# Patient Record
Sex: Male | Born: 1965 | Race: White | Hispanic: No | Marital: Married | Smoking: Never smoker
Health system: Southern US, Community
[De-identification: ages and names within clinical notes are randomized; demographics above are authoritative.]

## PROBLEM LIST (undated history)

## (undated) DIAGNOSIS — N451 Epididymitis: Secondary | ICD-10-CM

## (undated) DIAGNOSIS — J45909 Unspecified asthma, uncomplicated: Secondary | ICD-10-CM

## (undated) DIAGNOSIS — I10 Essential (primary) hypertension: Secondary | ICD-10-CM

## (undated) DIAGNOSIS — E785 Hyperlipidemia, unspecified: Secondary | ICD-10-CM

## (undated) HISTORY — DX: Epididymitis: N45.1

## (undated) HISTORY — DX: Unspecified asthma, uncomplicated: J45.909

## (undated) HISTORY — DX: Essential (primary) hypertension: I10

## (undated) HISTORY — PX: VASECTOMY: SHX75

## (undated) HISTORY — DX: Hyperlipidemia, unspecified: E78.5

## (undated) HISTORY — PX: NASAL SINUS SURGERY: SHX719

## (undated) HISTORY — PX: CHOLECYSTECTOMY: SHX55

---

## 2003-08-16 ENCOUNTER — Observation Stay (HOSPITAL_COMMUNITY): Admission: RE | Admit: 2003-08-16 | Discharge: 2003-08-17 | Payer: Self-pay | Admitting: General Surgery

## 2003-08-16 ENCOUNTER — Encounter (INDEPENDENT_AMBULATORY_CARE_PROVIDER_SITE_OTHER): Payer: Self-pay | Admitting: *Deleted

## 2004-02-21 ENCOUNTER — Ambulatory Visit: Payer: Self-pay | Admitting: Family Medicine

## 2004-03-11 ENCOUNTER — Ambulatory Visit: Payer: Self-pay | Admitting: Family Medicine

## 2004-03-27 ENCOUNTER — Ambulatory Visit: Payer: Self-pay | Admitting: Family Medicine

## 2004-05-20 ENCOUNTER — Ambulatory Visit: Payer: Self-pay | Admitting: Family Medicine

## 2004-07-14 ENCOUNTER — Ambulatory Visit: Payer: Self-pay | Admitting: Family Medicine

## 2004-10-22 ENCOUNTER — Ambulatory Visit: Payer: Self-pay | Admitting: Family Medicine

## 2004-11-04 ENCOUNTER — Ambulatory Visit: Payer: Self-pay | Admitting: Cardiology

## 2004-11-06 ENCOUNTER — Ambulatory Visit: Payer: Self-pay | Admitting: Family Medicine

## 2004-11-20 ENCOUNTER — Ambulatory Visit: Payer: Self-pay | Admitting: Family Medicine

## 2004-11-25 ENCOUNTER — Ambulatory Visit: Payer: Self-pay | Admitting: Cardiology

## 2005-01-20 ENCOUNTER — Ambulatory Visit: Payer: Self-pay | Admitting: Family Medicine

## 2005-01-27 ENCOUNTER — Ambulatory Visit: Payer: Self-pay | Admitting: Family Medicine

## 2005-03-26 ENCOUNTER — Ambulatory Visit: Payer: Self-pay | Admitting: Family Medicine

## 2005-04-09 ENCOUNTER — Ambulatory Visit: Payer: Self-pay | Admitting: Family Medicine

## 2005-04-20 IMAGING — RF DG CHOLANGIOGRAM OPERATIVE
1 series · 20 of 20 positions shown · non-contrast
Comparison: none

CLINICAL DATA: Gallstones.
 INTRAOPERATIVE CHOLANGIOGRAM
 Common bile duct is normal in size and there are no persistent filling defects or strictures.  There is spillage of contrast into the duodenum. 
 IMPRESSION 
 Normal study.

[Series 1: run · 20 of 20 slices shown]
[im 1/20]
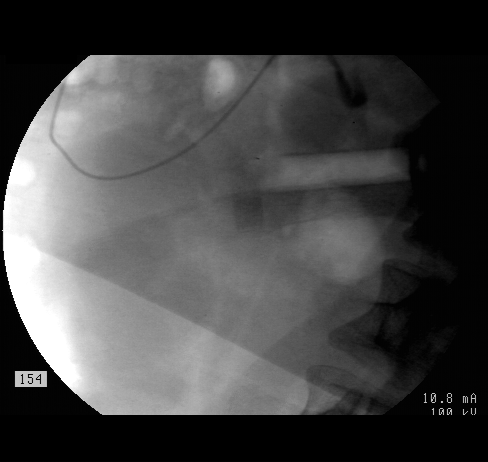
[im 2/20]
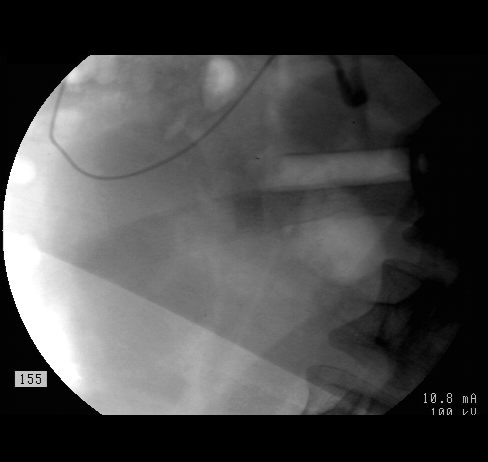
[im 3/20]
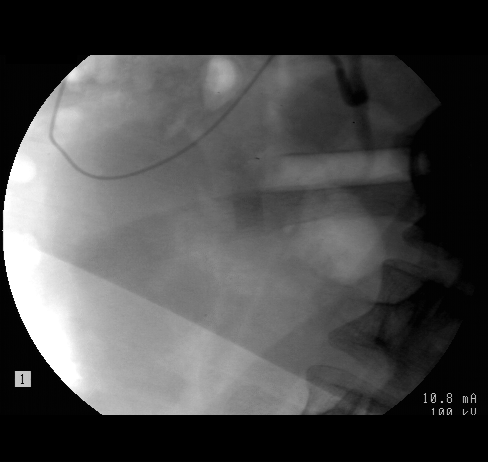
[im 4/20]
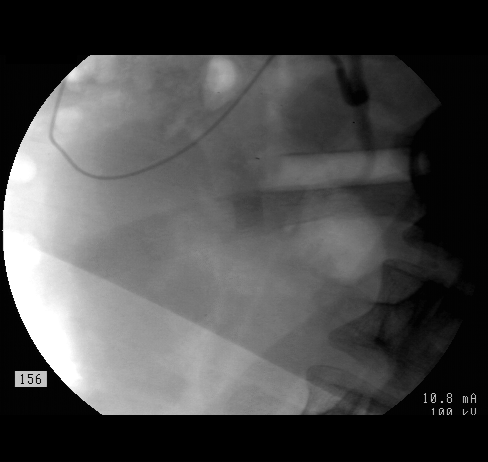
[im 5/20]
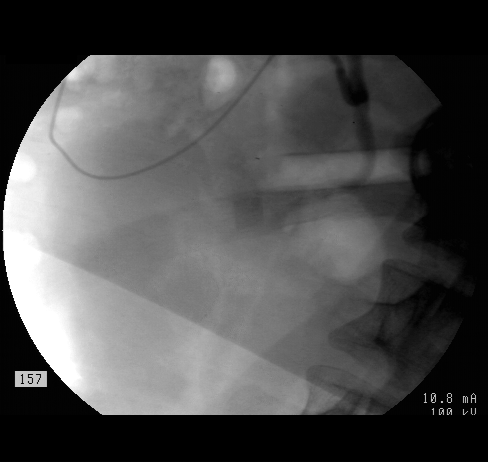
[im 6/20]
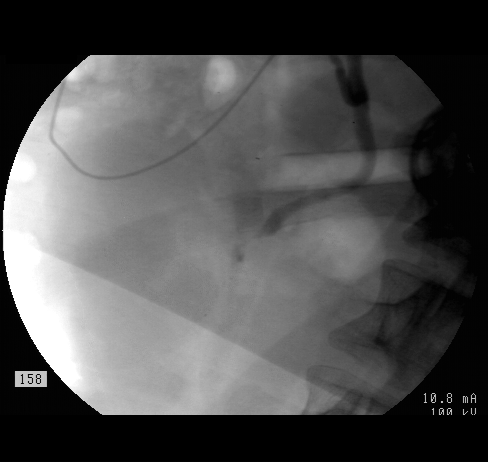
[im 7/20]
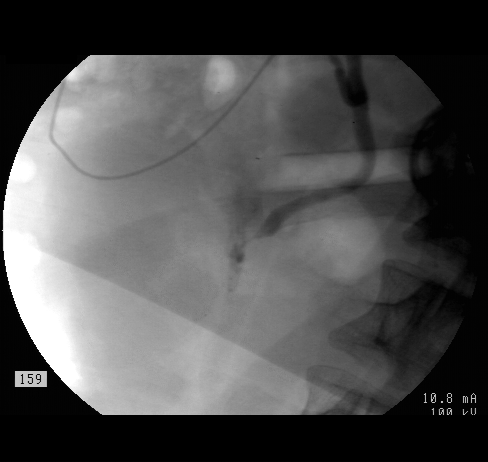
[im 8/20]
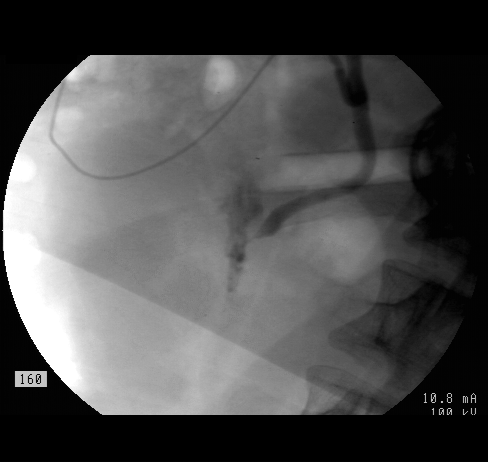
[im 9/20]
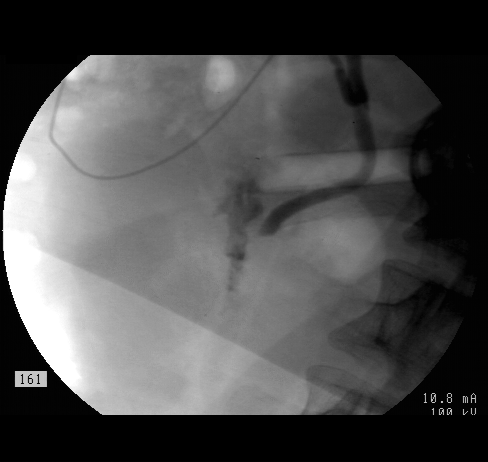
[im 10/20]
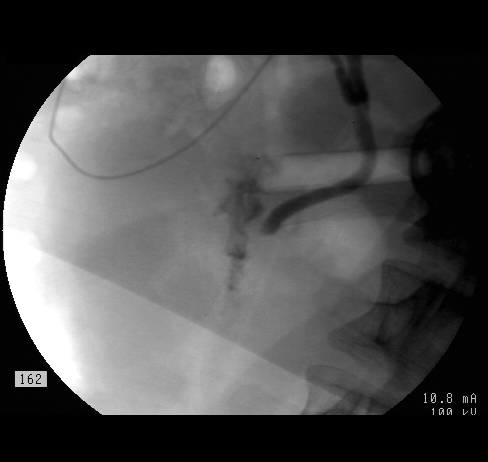
[im 11/20]
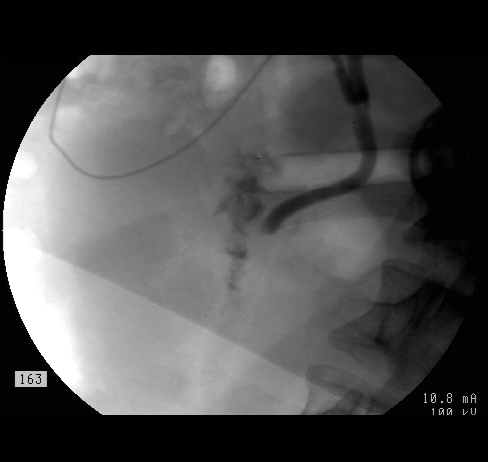
[im 12/20]
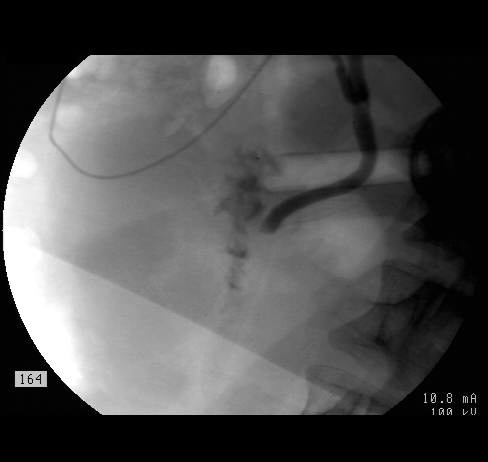
[im 13/20]
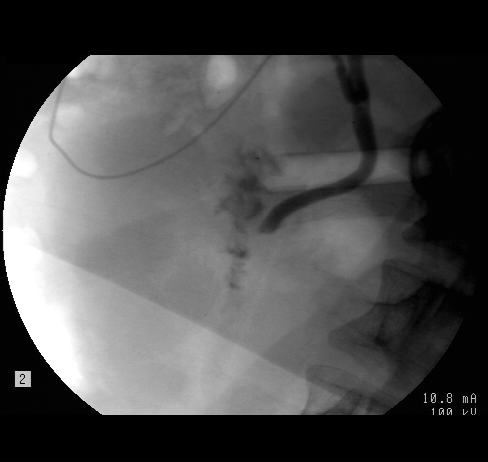
[im 14/20]
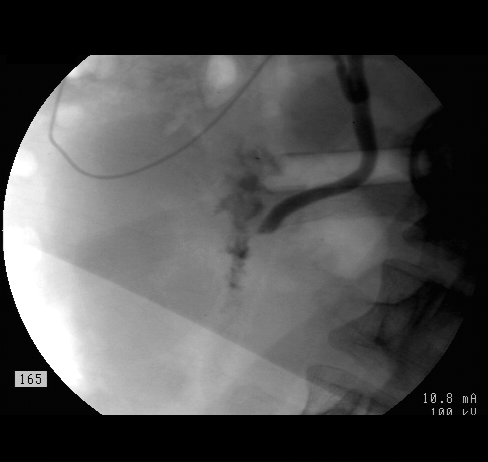
[im 15/20]
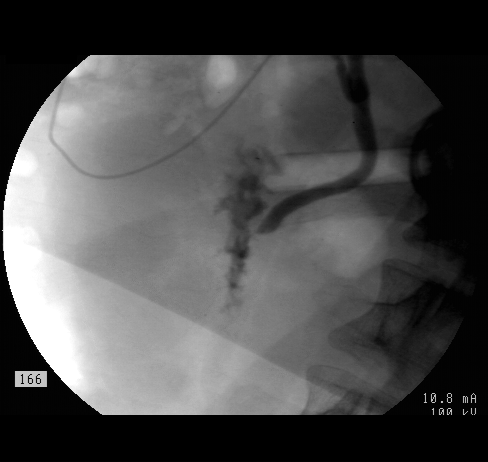
[im 16/20]
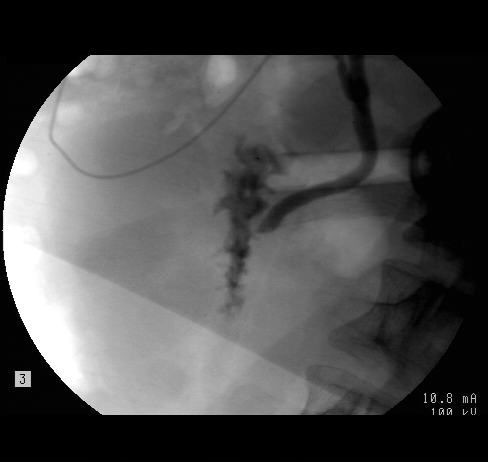
[im 17/20]
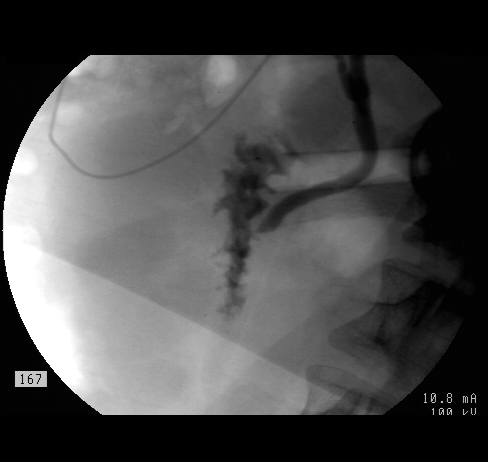
[im 18/20]
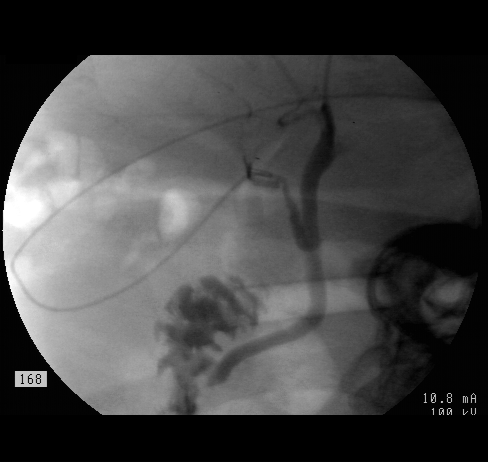
[im 19/20]
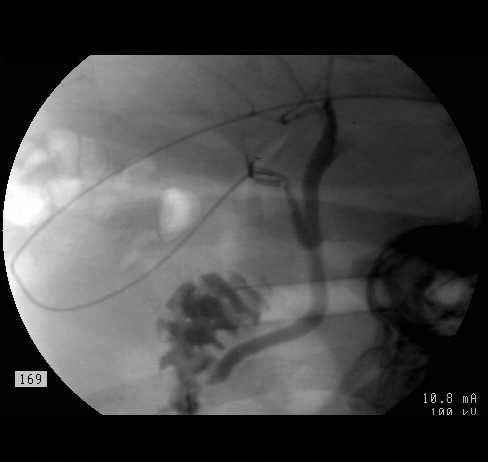
[im 20/20]
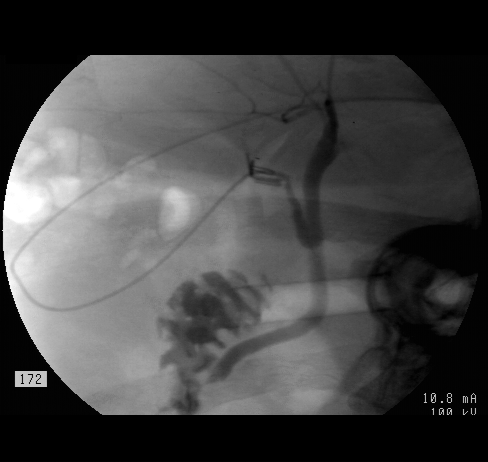

[20 of 20 positions shown; findings below may reference images not displayed]

## 2005-07-16 ENCOUNTER — Ambulatory Visit: Payer: Self-pay | Admitting: Family Medicine

## 2005-08-14 ENCOUNTER — Ambulatory Visit: Payer: Self-pay | Admitting: Family Medicine

## 2005-10-14 ENCOUNTER — Ambulatory Visit: Payer: Self-pay | Admitting: Family Medicine

## 2006-02-10 ENCOUNTER — Ambulatory Visit: Payer: Self-pay | Admitting: Family Medicine

## 2006-03-23 ENCOUNTER — Ambulatory Visit: Payer: Self-pay | Admitting: Family Medicine

## 2006-09-13 ENCOUNTER — Ambulatory Visit: Payer: Self-pay | Admitting: Family Medicine

## 2006-09-27 ENCOUNTER — Ambulatory Visit: Payer: Self-pay | Admitting: Family Medicine

## 2008-07-17 ENCOUNTER — Encounter: Admission: RE | Admit: 2008-07-17 | Discharge: 2008-07-17 | Payer: Self-pay | Admitting: Otolaryngology

## 2009-07-17 ENCOUNTER — Encounter: Admission: RE | Admit: 2009-07-17 | Discharge: 2009-07-17 | Payer: Self-pay | Admitting: Allergy

## 2010-08-22 NOTE — Op Note (Signed)
NAME:  KHIREE, BUKHARI NO.:  [id number]   MEDICAL RECORD NO.:  [id number]                   PATIENT TYPE:  AMB   LOCATION:  DAY                                  FACILITY:  [facility]   PHYSICIAN:  Anselm Pancoast. Zachery Dakins, M.D.          DATE OF BIRTH:  06-25-1965   DATE OF PROCEDURE:  08/16/2003  DATE OF DISCHARGE:                                 OPERATIVE REPORT   PREOPERATIVE DIAGNOSIS:  Chronic cholelithiasis.   POSTOPERATIVE DIAGNOSIS:  Chronic cholelithiasis.   OPERATION:  Laparoscopic cholecystectomy with cholangiogram.   SURGEON:  Dr. Consuello Bossier   ASSISTANT:  Dr. Kendrick Ranch   ANESTHESIA:  General.   HISTORY:  Per Beagley is a 45 year old Caucasian male referred to Dr.  Lysbeth Galas, who states that has had probably 5-6 episodes of epigastric pain, is  always right upper quadrant, sometimes after eating, occasionally has had  kind of painful to the back.  He was seen in Dr. Joyce Copa office and  referred to [facility] where he had an ultrasound of the gallbladder that  showed gallstones and a normal extrahepatic biliary system.  I saw him in  the office, and he said that he had recently been treated for pneumonitis  with oral antibiotics, and I think he is a cigarette smoker, and I  recommended he complete his course of antibiotics, and we would plan on  proceeding with microscopic cholecystectomy.  He comes in now, and he had  laboratory studies with a normal white count, normal liver function studies.  There was a slight elevation of his white cells, viral-type __________,  monocytes, etc.   Preoperatively, he was given 3 g of Unasyn, PAS stockings, and was taken to  the operative suite.  Induction of general anesthesia, endotracheal tube,  oral tube into the stomach, and then the abdomen was prepped with Betadine  surgical solution and draped in a sterile manner.  A small incision was made  right in the umbilicus and then sharp dissection and the  fascia identified.  There was a little preperitoneal fatty tissue coming up right at the  umbilicus, and I pushed that back in and then identified the fascia, made a  small opening, and then a pursestring suture of 0 Vicryl placed and Hasson  cannula introduced.  The gallbladder was moderately distended but not  acutely inflamed.  The upper 10 mm trocar was placed in subxiphoid area,  through the falciform ligament after anesthetizing the fascia and two  lateral 5 mm trocars were placed by Dr. Earlene Plater.  The gallbladder was  retracted upward and outward and then followed down.  You could see where  the transition of the proximal gallbladder cystic duct was, and this was  carefully opened.  The artery was kind of anterior to the duct, doubly  clipped it proximally, singly distally, and divided it, and exposed the  cystic duct junction of the gallbladder.  A  clip was placed flush across the  neck of the gallbladder, small opening made, and cholangiocatheter placed  into the proximal duct, and then held in place with a clip.  The x-rays  showed good extrahepatic biliary system, good flow into the duodenum.  There  was probably about a 3 cm cystic duct remnant.  The catheter was removed.  The cystic duct was clipped probably about 1 cm more proximally, triply,  then divided and then the gallbladder freed from its bed using the hook  electrocautery.  Hemostasis was good.  The gallbladder was then grasped,  brought out through the umbilicus.  The figure-of-eight suture, in addition  to the fascia suture was placed, and then another figure-of-eight was placed  except he had that little weakness at the umbilicus, and there was no  evidence of any herniation through the fascia.  The irrigating fluid had  been aspirated, anesthetized the fascia, and then the subcutaneous wounds  were closed with 4-0 Vicryl, Benzoin  and Steri-Strips on the skin.  The patient tolerated the procedure nicely  and was sent  to the recovery room in stable postop condition.  He will be  released either this evening or in the morning, hopefully will be able to  return to work in approximately a week.  He is a Network engineer at  [employer].                                               Anselm Pancoast. Zachery Dakins, M.D.    WJW/MEDQ  D:  08/16/2003  T:  08/16/2003  Job:  [id number]   cc:   Dr. Eloise Harman [facility]

## 2011-11-11 ENCOUNTER — Encounter (HOSPITAL_BASED_OUTPATIENT_CLINIC_OR_DEPARTMENT_OTHER): Payer: Self-pay

## 2011-11-18 ENCOUNTER — Encounter (HOSPITAL_BASED_OUTPATIENT_CLINIC_OR_DEPARTMENT_OTHER): Payer: Self-pay

## 2011-11-23 ENCOUNTER — Ambulatory Visit (HOSPITAL_BASED_OUTPATIENT_CLINIC_OR_DEPARTMENT_OTHER): Payer: Managed Care, Other (non HMO) | Attending: Family Medicine

## 2011-11-23 DIAGNOSIS — G4733 Obstructive sleep apnea (adult) (pediatric): Secondary | ICD-10-CM | POA: Insufficient documentation

## 2011-11-28 DIAGNOSIS — G4733 Obstructive sleep apnea (adult) (pediatric): Secondary | ICD-10-CM

## 2011-12-09 ENCOUNTER — Encounter: Payer: Self-pay | Admitting: Internal Medicine

## 2011-12-09 ENCOUNTER — Encounter: Payer: Self-pay | Admitting: *Deleted

## 2011-12-10 ENCOUNTER — Ambulatory Visit (INDEPENDENT_AMBULATORY_CARE_PROVIDER_SITE_OTHER): Payer: Managed Care, Other (non HMO) | Admitting: Internal Medicine

## 2011-12-10 ENCOUNTER — Encounter: Payer: Self-pay | Admitting: Internal Medicine

## 2011-12-10 VITALS — BP 132/82 | HR 73 | Ht 71.0 in | Wt 236.6 lb

## 2011-12-10 DIAGNOSIS — G4733 Obstructive sleep apnea (adult) (pediatric): Secondary | ICD-10-CM

## 2011-12-10 NOTE — Progress Notes (Signed)
12/10/11- 46 yo M never smoker referred courtesy of Dr. Lysbeth Galas for sleep medicine evaluation. He complains that he stays tired. 4 years he has wakened 2 or 3 times per night after bedtime between 10 and 11 PM. 30 minutes sleep latency. Loud snoring. In the morning he feels unrested with no dream recall. He denies problems driving but if he sits at home he falls asleep. Caffeine intake described as 2 or 3 Pepsis per day. Allergic rhinitis managed by Dr. [place] Callas with allergy vaccine which has helped nasal congestion. Medical problems include hypertension and asthma. Nasal polypectomy and maxillary sinus surgery in the past but no aspirin intolerance. No family history of sleep apnea. Father has history of coronary disease. Sleep study- an unattended home sleep study on 11/23/2011 documented mild obstructive sleep apnea/hypoxia syndrome, AHI 10.3 per hour with oxygen desaturation to a nadir of 66%.  Prior to Admission medications   Medication Sig Start Date End Date Taking? Authorizing Provider  albuterol (PROVENTIL HFA;VENTOLIN HFA) 108 (90 BASE) MCG/ACT inhaler Inhale 2 puffs into the lungs every 6 (six) hours as needed.   Yes Historical Provider, MD  Amlodipine-Valsartan-HCTZ (EXFORGE HCT) 5-160-12.5 MG TABS Take by mouth. As directed   Yes Historical Provider, MD  atorvastatin (LIPITOR) 20 MG tablet Take 20 mg by mouth daily.   Yes Historical Provider, MD  fluticasone (FLONASE) 50 MCG/ACT nasal spray Place 2 sprays into the nose daily.   Yes Historical Provider, MD  Fluticasone-Salmeterol (ADVAIR) 250-50 MCG/DOSE AEPB Inhale 1 puff into the lungs every 12 (twelve) hours.   Yes Historical Provider, MD   Past Medical History  Diagnosis Date  . Asthma   . HTN (hypertension)   . Hyperlipemia   . Epididymitis    Past Surgical History  Procedure Date  . Vasectomy    Family History  Problem Relation Age of Onset  . Heart disease Father     bypass surgeries x 2  . Asthma Mother   . Cancer     grandfather   History   Social History  . Marital Status: Married    Spouse Name: N/A    Number of Children: 2  . Years of Education: N/A   Occupational History  . plant supervisor    Social History Main Topics  . Smoking status: Never Smoker   . Smokeless tobacco: Not on file  . Alcohol Use: Not on file  . Drug Use: No  . Sexually Active: Not on file   Other Topics Concern  . Not on file   Social History Narrative  . No narrative on file   ROS-see HPI Constitutional:   No-   weight loss, night sweats, fevers, chills, fatigue, lassitude. HEENT:   No-  headaches, difficulty swallowing, tooth/dental problems, sore throat,       Controlled- sneezing, itching, ear ache, nasal congestion, post nasal drip,  CV:  No-   chest pain, orthopnea, PND, swelling in lower extremities, anasarca, dizziness, palpitations Resp: No-   shortness of breath with exertion or at rest.              No-   productive cough,  No non-productive cough,  No- coughing up of blood.              No-   change in color of mucus.  No- wheezing.   Skin: No-   rash or lesions. GI:  No-   heartburn, indigestion, abdominal pain, nausea, vomiting, diarrhea,  change in bowel habits, loss of appetite GU: No-   dysuria, change in color of urine, no urgency or frequency.  No- flank pain. MS:  No-   joint pain or swelling.  No- decreased range of motion.  No- back pain. Neuro-     nothing unusual Psych:  No- change in mood or affect. No depression or anxiety.  No memory loss.  OBJ- Physical Exam General- Alert, Oriented, Affect-appropriate, Distress- none acute, stocky body build Skin- rash-none, lesions- none, excoriation- none Lymphadenopathy- none Head- atraumatic            Eyes- Gross vision intact, PERRLA, conjunctivae and secretions clear            Ears- Hearing, canals-normal            Nose- + mucus, no-Septal dev,  polyps, erosion, perforation             Throat- Mallampati III , mucosa  clear , drainage- none, +tonsils-are present  Neck- flexible , trachea midline, no stridor , thyroid nl, carotid no bruit Chest - symmetrical excursion , unlabored           Heart/CV- RRR , no murmur , no gallop  , no rub, nl s1 s2                           - JVD- none , edema- none, stasis changes- none, varices- none           Lung- clear to P&A, wheeze- none, cough- none , dullness-none, rub- none           Chest wall-  Abd- tender-no, distended-no, bowel sounds-present, HSM- no Br/ Gen/ Rectal- Not done, not indicated Extrem- cyanosis- none, clubbing, none, atrophy- none, strength- nl Neuro- grossly intact to observation

## 2011-12-10 NOTE — Patient Instructions (Addendum)
[facility] autotitrate 5-20 cwp x 7-14 days for pressure recommendation, mask of choice, humidifier    Dx OSA  Please call as needed

## 2011-12-19 DIAGNOSIS — G4733 Obstructive sleep apnea (adult) (pediatric): Secondary | ICD-10-CM | POA: Insufficient documentation

## 2011-12-19 NOTE — Assessment & Plan Note (Signed)
We have reviewed the basics of good sleep hygiene, physiology of sleep apnea, medical concerns of untreated sleep apnea and treatment choices. I have emphasized weight control and his responsibility to drive safely. Plan-new CPAP starting with autotitration.

## 2012-01-26 ENCOUNTER — Ambulatory Visit: Payer: Managed Care, Other (non HMO) | Admitting: Internal Medicine

## 2012-02-15 ENCOUNTER — Encounter: Payer: Self-pay | Admitting: Internal Medicine

## 2012-03-15 ENCOUNTER — Encounter: Payer: Self-pay | Admitting: Internal Medicine

## 2012-03-15 ENCOUNTER — Ambulatory Visit (INDEPENDENT_AMBULATORY_CARE_PROVIDER_SITE_OTHER): Payer: Managed Care, Other (non HMO) | Admitting: Internal Medicine

## 2012-03-15 VITALS — BP 120/74 | HR 75 | Ht 71.0 in | Wt 237.2 lb

## 2012-03-15 DIAGNOSIS — G4733 Obstructive sleep apnea (adult) (pediatric): Secondary | ICD-10-CM

## 2012-03-15 NOTE — Patient Instructions (Addendum)
Order- [facility] Allegra- change CPAP to fixed pressure 9 cwp  Please call as needed

## 2012-03-15 NOTE — Progress Notes (Signed)
12/10/11- 46 yo M never smoker referred courtesy of Dr. Lysbeth Galas for sleep medicine evaluation. He complains that he stays tired. 4 years he has wakened 2 or 3 times per night after bedtime between 10 and 11 PM. 30 minutes sleep latency. Loud snoring. In the morning he feels unrested with no dream recall. He denies problems driving but if he sits at home he falls asleep. Caffeine intake described as 2 or 3 Pepsis per day. Allergic rhinitis managed by Dr. [place] Callas with allergy vaccine which has helped nasal congestion. Medical problems include hypertension and asthma. Nasal polypectomy and maxillary sinus surgery in the past but no aspirin intolerance. No family history of sleep apnea. Father has history of coronary disease. Sleep study- an unattended home sleep study on 11/23/2011 documented mild obstructive sleep apnea/hypoxia syndrome, AHI 10.3 per hour with oxygen desaturation to a nadir of 66%.  03/15/12-46 yo M never smoker followed for OSA, complicated by allergic rhinitis/ asthma (Dr [place] Callas) FOLLOWS FOR: wears CPAP AutoPAP/ [facility] every night minus 2-3 nights due to ? Sickness. Sleeps well. Wife here his pressure changes and we discussed switch to a fixed pressure setting. Using a fullface mask. Download from 01/01/2012 showed good compliance and fair control with recommended fixed pressure of 9 CWP.  ROS-see HPI Constitutional:   No-   weight loss, night sweats, fevers, chills, fatigue, lassitude. HEENT:   No-  headaches, difficulty swallowing, tooth/dental problems, sore throat,       Controlled- sneezing, itching, ear ache, nasal congestion, post nasal drip,  CV:  No-   chest pain, orthopnea, PND, swelling in lower extremities, anasarca, dizziness, palpitations Resp: No-   shortness of breath with exertion or at rest.              No-   productive cough,  No non-productive cough,  No- coughing up of blood.              No-   change in color of mucus.  No- wheezing.   Skin: No-   rash or  lesions. GI:  No-   heartburn, indigestion, abdominal pain, nausea, vomiting,  GU:  MS:  No-   joint pain or swelling.   Neuro-     nothing unusual Psych:  No- change in mood or affect. No depression or anxiety.  No memory loss.  OBJ- Physical Exam General- Alert, Oriented, Affect-appropriate, Distress- none acute, stocky body build Skin- rash-none, lesions- none, excoriation- none Lymphadenopathy- none Head- atraumatic            Eyes- Gross vision intact, PERRLA, conjunctivae and secretions clear            Ears- Hearing, canals-normal            Nose- + mucus, no-Septal dev,  polyps, erosion, perforation             Throat- Mallampati III , mucosa clear , drainage- none, +tonsils-are present  Neck- flexible , trachea midline, no stridor , thyroid nl, carotid no bruit Chest - symmetrical excursion , unlabored           Heart/CV- RRR , no murmur , no gallop  , no rub, nl s1 s2                           - JVD- none , edema- none, stasis changes- none, varices- none           Lung- clear to P&A, wheeze- none,  cough- none , dullness-none, rub- none           Chest wall-  Abd-  Br/ Gen/ Rectal- Not done, not indicated Extrem- cyanosis- none, clubbing, none, atrophy- none, strength- nl Neuro- grossly intact to observation

## 2012-03-23 NOTE — Assessment & Plan Note (Signed)
Doing well with CPAP autotitration. I encouraged him to use CPAP all night every night. We can change pressure to 9 as discussed.

## 2012-04-18 ENCOUNTER — Encounter: Payer: Self-pay | Admitting: Internal Medicine

## 2012-09-14 ENCOUNTER — Encounter: Payer: Self-pay | Admitting: Internal Medicine

## 2012-09-14 ENCOUNTER — Ambulatory Visit (INDEPENDENT_AMBULATORY_CARE_PROVIDER_SITE_OTHER): Payer: Managed Care, Other (non HMO) | Admitting: Internal Medicine

## 2012-09-14 VITALS — BP 158/100 | HR 76 | Ht 71.0 in | Wt 243.2 lb

## 2012-09-14 DIAGNOSIS — G4733 Obstructive sleep apnea (adult) (pediatric): Secondary | ICD-10-CM

## 2012-09-14 NOTE — Progress Notes (Signed)
12/10/11- 47 yo M never smoker referred courtesy of Dr. Lysbeth Galas for sleep medicine evaluation. He complains that he stays tired. 4 years he has wakened 2 or 3 times per night after bedtime between 10 and 11 PM. 30 minutes sleep latency. Loud snoring. In the morning he feels unrested with no dream recall. He denies problems driving but if he sits at home he falls asleep. Caffeine intake described as 2 or 3 Pepsis per day. Allergic rhinitis managed by Dr. [place] Callas with allergy vaccine which has helped nasal congestion. Medical problems include hypertension and asthma. Nasal polypectomy and maxillary sinus surgery in the past but no aspirin intolerance. No family history of sleep apnea. Father has history of coronary disease. Sleep study- an unattended home sleep study on 11/23/2011 documented mild obstructive sleep apnea/hypoxia syndrome, AHI 10.3 per hour with oxygen desaturation to a nadir of 66%.  03/15/12-46 yo M never smoker followed for OSA, complicated by allergic rhinitis/ asthma (Dr  Callas) FOLLOWS FOR: wears CPAP AutoPAP/ [facility] every night minus 2-3 nights due to ? Sickness. Sleeps well. Wife here his pressure changes and we discussed switch to a fixed pressure setting. Using a fullface mask. Download from 01/01/2012 showed good compliance and fair control with recommended fixed pressure of 9 CWP.  09/14/12- 22 yo M never smoker followed for OSA, complicated by allergic rhinitis/ asthma (Dr  Callas) FOLLOWS FOR: pt reports wearing CPAP 9/ [facility] most nights avg 4-5 hrs per night sometimes awakens and mask is off, also states machines pressure at 9 is too much pressure in the beginning he has to decrease pressure--denies any other concerns at this time We discussed the Ramp feature of his CPAP machine. He definitely feels better with CPAP than without it. Better rested.  ROS-see HPI Constitutional:   No-   weight loss, night sweats, fevers, chills, fatigue, lassitude. HEENT:   No-  headaches,  difficulty swallowing, tooth/dental problems, sore throat,       Controlled- sneezing, itching, ear ache, nasal congestion, post nasal drip,  CV:  No-   chest pain, orthopnea, PND, swelling in lower extremities, anasarca, dizziness, palpitations Resp: No-   shortness of breath with exertion or at rest.              No-   productive cough,  No non-productive cough,  No- coughing up of blood.              No-   change in color of mucus.  No- wheezing.   Skin: No-   rash or lesions. GI:  No-   heartburn, indigestion, abdominal pain, nausea, vomiting,  GU:  MS:  No-   joint pain or swelling.   Neuro-     nothing unusual Psych:  No- change in mood or affect. No depression or anxiety.  No memory loss.  OBJ- Physical Exam General- Alert, Oriented, Affect-appropriate, Distress- none acute, stocky body build Skin- rash-none, lesions- none, excoriation- none Lymphadenopathy- none Head- atraumatic            Eyes- Gross vision intact, PERRLA, conjunctivae and secretions clear            Ears- Hearing, canals-normal            Nose- clear, no-Septal dev,  polyps, erosion, perforation             Throat- Mallampati III , mucosa clear , drainage- none, +tonsils-are present  Neck- flexible , trachea midline, no stridor , thyroid nl, carotid no bruit Chest - symmetrical  excursion , unlabored           Heart/CV- RRR , no murmur , no gallop  , no rub, nl s1 s2                           - JVD- none , edema- none, stasis changes- none, varices- none           Lung- clear to P&A, wheeze- none, cough- none , dullness-none, rub- none           Chest wall-  Abd-  Br/ Gen/ Rectal- Not done, not indicated Extrem- cyanosis- none, clubbing, none, atrophy- none, strength- nl Neuro- grossly intact to observation

## 2012-09-14 NOTE — Patient Instructions (Addendum)
We can continue CPAP 9/ [facility]    Please call as needed

## 2012-09-26 NOTE — Assessment & Plan Note (Signed)
I don't think his final set pressure at 9 is too high, but he is having difficult tolerating it at the beginning of the night.. He will get instruction on management of the Ramp control from [place].

## 2013-09-20 ENCOUNTER — Ambulatory Visit: Payer: Managed Care, Other (non HMO) | Admitting: Internal Medicine

## 2019-06-15 ENCOUNTER — Ambulatory Visit: Payer: Managed Care, Other (non HMO) | Admitting: Internal Medicine

## 2019-06-15 ENCOUNTER — Other Ambulatory Visit: Payer: Self-pay

## 2019-06-15 ENCOUNTER — Encounter: Payer: Self-pay | Admitting: Internal Medicine

## 2019-06-15 VITALS — BP 142/78 | HR 88 | Temp 97.0°F | Ht 71.0 in | Wt 246.0 lb

## 2019-06-15 DIAGNOSIS — J4530 Mild persistent asthma, uncomplicated: Secondary | ICD-10-CM | POA: Diagnosis not present

## 2019-06-15 DIAGNOSIS — J339 Nasal polyp, unspecified: Secondary | ICD-10-CM

## 2019-06-15 DIAGNOSIS — G4733 Obstructive sleep apnea (adult) (pediatric): Secondary | ICD-10-CM

## 2019-06-15 DIAGNOSIS — J3089 Other allergic rhinitis: Secondary | ICD-10-CM

## 2019-06-15 DIAGNOSIS — J302 Other seasonal allergic rhinitis: Secondary | ICD-10-CM | POA: Insufficient documentation

## 2019-06-15 NOTE — Assessment & Plan Note (Signed)
He has gained weight since original sleep study. Compliant and benefiting from CPAP. Plan- replace old machine, changing to auto 5-15

## 2019-06-15 NOTE — Progress Notes (Signed)
06/15/19- 54 yoM divorced never smoker for sleep evaluation. 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 Cardiologist Last seen in 2014.  He continues using CPAP 9/ [facility]. Machine is about 55 years old. Medical problem list includes Asthma, Allergic Rhinitis, Nasal polyps HTN, Hyperlipidemia, Hx nasal polypectomy, maxillary sinus surgery. Allergy and asthma managed by Dr [place] Callas, on allergy vaccine qow. HST 11/23/11- AHI 10.3/ hr, Epworth 10/ 24, Weight 220 lbs Meds include Advair 115 HFA, flonase, albuterol hfa -----OSA. Patient last seen 09/14/12.  Body weight today 246 lbs Epworth score 1 with CPAP Download compliance 100%, AHI 2.1/ hr No one at home to report snoring, but he sleeps very weill with CPAP and denies EDS.  Very uncomfortable if he has to miss a night due to power outage.  Has So-Clean machine.  Prior to Admission medications   Medication Sig Start Date End Date Taking? Authorizing Provider  albuterol (PROVENTIL HFA;VENTOLIN HFA) 108 (90 BASE) MCG/ACT inhaler Inhale 2 puffs into the lungs every 6 (six) hours as needed.   Yes [provider]  amLODipine (NORVASC) 10 MG tablet Take by mouth. 04/23/19  Yes [provider]  Amlodipine-Valsartan-HCTZ (EXFORGE HCT) 5-160-12.5 MG TABS Take by mouth. As directed   Yes [provider]  atorvastatin (LIPITOR) 20 MG tablet Take 20 mg by mouth daily.   Yes [provider]  EPINEPHrine 0.3 mg/0.3 mL IJ SOAJ injection  04/07/17  Yes [provider]  fluticasone (FLONASE) 50 MCG/ACT nasal spray Place 2 sprays into the nose daily.   Yes [provider]  fluticasone-salmeterol (ADVAIR HFA) 115-21 MCG/ACT inhaler Inhale 2 puffs into the lungs 2 (two) times daily.   Yes [provider]  sildenafil (VIAGRA) 50 MG tablet Take by mouth. 05/08/19  Yes [provider]   Past Medical History:  Diagnosis Date  . Asthma   . Epididymitis   . HTN (hypertension)   .  Hyperlipemia    Past Surgical History:  Procedure Laterality Date  . CHOLECYSTECTOMY N/A   . VASECTOMY     Family History  Problem Relation Age of Onset  . Heart disease Father        bypass surgeries x 2  . Asthma Mother   . Cancer Unknown        grandfather   Social History   Socioeconomic History  . Marital status: Married    Spouse name: Not on file  . Number of children: 2  . Years of education: Not on file  . Highest education level: Not on file  Occupational History  . Occupation: Location manager  Tobacco Use  . Smoking status: Never Smoker  . Smokeless tobacco: Never Used  Substance and Sexual Activity  . Alcohol use: Not on file  . Drug use: No  . Sexual activity: Not on file  Other Topics Concern  . Not on file  Social History Narrative  . Not on file   Social Determinants of Health   Financial Resource Strain:   . Difficulty of Paying Living Expenses:   Food Insecurity:   . Worried About Programme researcher, broadcasting/film/video in the Last Year:   . Barista in the Last Year:   Transportation Needs:   . Freight forwarder (Medical):   Marland Kitchen Lack of Transportation (Non-Medical):   Physical Activity:   . Days of Exercise per Week:   . Minutes of Exercise per Session:   Stress:   . Feeling of Stress :  Social Connections:   . Frequency of Communication with Friends and Family:   . Frequency of Social Gatherings with Friends and Family:   . Attends Religious Services:   . Active Member of Clubs or Organizations:   . Attends Archivist Meetings:   Marland Kitchen Marital Status:   Intimate Partner Violence:   . Fear of Current or Ex-Partner:   . Emotionally Abused:   Marland Kitchen Physically Abused:   . Sexually Abused:    ROS-see HPI   + = positive Constitutional:    weight loss, night sweats, fevers, chills, fatigue, lassitude. HEENT:    headaches, difficulty swallowing, tooth/dental problems, sore throat,       sneezing, itching, ear ache, +nasal congestion, post  nasal drip, snoring CV:    chest pain, orthopnea, PND, swelling in lower extremities, anasarca,                                  dizziness, palpitations Resp:   shortness of breath with exertion or at rest.                productive cough,   non-productive cough, coughing up of blood.              change in color of mucus.  wheezing.   Skin:    rash or lesions. GI:  No-   heartburn, indigestion, abdominal pain, nausea, vomiting, diarrhea,                 change in bowel habits, loss of appetite GU: dysuria, change in color of urine, no urgency or frequency.   flank pain. MS:   joint pain, stiffness, decreased range of motion, back pain. Neuro-     nothing unusual Psych:  change in mood or affect.  depression or anxiety.   memory loss.  OBJ- Physical Exam General- Alert, Oriented, Affect-appropriate, Distress- none acute, +stocky/ overweight Skin- rash-none, lesions- none, excoriation- none Lymphadenopathy- none Head- atraumatic            Eyes- Gross vision intact, PERRLA, conjunctivae and secretions clear            Ears- Hearing, canals-normal            Nose- Clear, no-Septal dev, mucus, +polyps, erosion, perforation             Throat- Mallampati III , mucosa clear , drainage- none, tonsils- atrophic Neck- flexible , trachea midline, no stridor , thyroid nl, carotid no bruit Chest - symmetrical excursion , unlabored           Heart/CV- RRR , no murmur , no gallop  , no rub, nl s1 s2                           - JVD- none , edema- none, stasis changes- none, varices- none           Lung- clear to P&A, wheeze- none, cough- none , dullness-none, rub- none           Chest wall-  Abd-  Br/ Gen/ Rectal- Not done, not indicated Extrem- cyanosis- none, clubbing, none, atrophy- none, strength- nl Neuro- grossly intact to observation

## 2019-06-15 NOTE — Assessment & Plan Note (Signed)
Complicated by recurrent nasal polyps To be managed by his Allergist

## 2019-06-15 NOTE — Patient Instructions (Signed)
Order- DME [facility] please replace old CPAP machine, change to auto 5-15, mask of choice, humidifier, supplies, AirView/ card  Please call if we can help

## 2019-06-15 NOTE — Assessment & Plan Note (Signed)
Visible polyps on exam with hx polypectomy. Not known to be aspirin sensitive. Plan- advised polyps are present, to discuss with his allergist and ENT

## 2019-06-15 NOTE — Assessment & Plan Note (Signed)
Uncomplicated at this time on maintenance Advair. Plan- f/u w Allergist

## 2019-11-09 ENCOUNTER — Encounter (HOSPITAL_BASED_OUTPATIENT_CLINIC_OR_DEPARTMENT_OTHER): Payer: Self-pay | Admitting: Emergency Medicine

## 2019-11-09 ENCOUNTER — Emergency Department (HOSPITAL_BASED_OUTPATIENT_CLINIC_OR_DEPARTMENT_OTHER): Payer: Managed Care, Other (non HMO)

## 2019-11-09 ENCOUNTER — Emergency Department (HOSPITAL_BASED_OUTPATIENT_CLINIC_OR_DEPARTMENT_OTHER)
Admission: EM | Admit: 2019-11-09 | Discharge: 2019-11-09 | Disposition: A | Discharge status: Home / Self Care | Payer: Managed Care, Other (non HMO) | Attending: Emergency Medicine | Admitting: Emergency Medicine

## 2019-11-09 ENCOUNTER — Other Ambulatory Visit: Payer: Self-pay

## 2019-11-09 DIAGNOSIS — Z7951 Long term (current) use of inhaled steroids: Secondary | ICD-10-CM | POA: Insufficient documentation

## 2019-11-09 DIAGNOSIS — Z79899 Other long term (current) drug therapy: Secondary | ICD-10-CM | POA: Insufficient documentation

## 2019-11-09 DIAGNOSIS — I10 Essential (primary) hypertension: Secondary | ICD-10-CM | POA: Insufficient documentation

## 2019-11-09 DIAGNOSIS — J45909 Unspecified asthma, uncomplicated: Secondary | ICD-10-CM | POA: Diagnosis not present

## 2019-11-09 DIAGNOSIS — R05 Cough: Secondary | ICD-10-CM | POA: Diagnosis present

## 2019-11-09 DIAGNOSIS — J209 Acute bronchitis, unspecified: Secondary | ICD-10-CM

## 2019-11-09 DIAGNOSIS — J9801 Acute bronchospasm: Secondary | ICD-10-CM | POA: Insufficient documentation

## 2019-11-09 MED ORDER — ALBUTEROL SULFATE HFA 108 (90 BASE) MCG/ACT IN AERS
2.0000 | INHALATION_SPRAY | RESPIRATORY_TRACT | Status: DC | PRN
  Administered 2019-11-09: 2 via RESPIRATORY_TRACT
  Filled 2019-11-09: qty 6.7

## 2019-11-09 NOTE — ED Triage Notes (Signed)
Sinus infection for 7 days, on antibiotics. C/O productive cough for 4 days Afebrile. No SOB. VSS.

## 2019-11-09 NOTE — ED Notes (Signed)
ED Provider at bedside. 

## 2019-11-09 NOTE — ED Provider Notes (Signed)
[facility] Provider Note: Lowella Dell, MD, FACEP  CSN: [id number] MRN: [id number] ARRIVAL: 11/09/19 at 0035 ROOM: [facility]   CHIEF COMPLAINT  Cough   HISTORY OF PRESENT ILLNESS  11/09/19 4:40 AM Ernest Wilcox is a 54 y.o. male with history of asthma.  With about a week of nasal congestion and postnasal drip.  He has had a productive cough and scratchy throat for 4 days.  He was seen by telemedicine and prescribed Augmentin which she started 3 days ago.  He has an albuterol inhaler which he started using a again yesterday.  He is here concerned that he may have something worse than just a sinus congestion.  He rates the pain in his throat as a 4 out of 10, worse with coughing.   Past Medical History:  Diagnosis Date  . Asthma   . Epididymitis   . HTN (hypertension)   . Hyperlipemia     Past Surgical History:  Procedure Laterality Date  . CHOLECYSTECTOMY N/A   . VASECTOMY      Family History  Problem Relation Age of Onset  . Heart disease Father        bypass surgeries x 2  . Asthma Mother   . Cancer Other        grandfather    Social History   Tobacco Use  . Smoking status: Never Smoker  . Smokeless tobacco: Never Used  Substance Use Topics  . Alcohol use: Not on file  . Drug use: No    Prior to Admission medications   Medication Sig Start Date End Date Taking? Authorizing Provider  amoxicillin-clavulanate (AUGMENTIN) 875-125 MG tablet Take 1 tablet by mouth 2 (two) times daily.   Yes [provider]  hydrochlorothiazide (MICROZIDE) 12.5 MG capsule Take 1 capsule by mouth daily. 10/19/19  Yes [provider]  albuterol (PROVENTIL HFA;VENTOLIN HFA) 108 (90 BASE) MCG/ACT inhaler Inhale 2 puffs into the lungs every 6 (six) hours as needed.    [provider]  amLODipine (NORVASC) 10 MG tablet Take by mouth. 04/23/19   [provider]  Amlodipine-Valsartan-HCTZ (EXFORGE HCT) 5-160-12.5 MG TABS Take by mouth. As  directed    [provider]  atorvastatin (LIPITOR) 20 MG tablet Take 20 mg by mouth daily.    [provider]  EPINEPHrine 0.3 mg/0.3 mL IJ SOAJ injection  04/07/17   [provider]  fluticasone (FLONASE) 50 MCG/ACT nasal spray Place 2 sprays into the nose daily.    [provider]  fluticasone-salmeterol (ADVAIR HFA) 115-21 MCG/ACT inhaler Inhale 2 puffs into the lungs 2 (two) times daily.    [provider]  sildenafil (VIAGRA) 50 MG tablet Take by mouth. 05/08/19   [provider]    Allergies Patient has no known allergies.   REVIEW OF SYSTEMS  Negative except as noted here or in the History of Present Illness.   PHYSICAL EXAMINATION  Initial Vital Signs Blood pressure (!) 144/94, pulse 72, temperature 98.2 F (36.8 C), temperature source Oral, resp. rate 18, SpO2 99 %.  Examination General: Well-developed, well-nourished male in no acute distress; appearance consistent with age of record HENT: normocephalic; atraumatic; mild pharyngeal erythema without edema or exudate Eyes: pupils equal, round and reactive to light; extraocular muscles intact Neck: supple; no lymphadenopathy Heart: regular rate and rhythm Lungs: clear to auscultation bilaterally; cough on deep breathing Abdomen: soft; nondistended; nontender; bowel sounds present Extremities: No deformity; full range of motion; pulses normal Neurologic: Awake, alert  and oriented; motor function intact in all extremities and symmetric; no facial droop Skin: Warm and dry Psychiatric: Normal mood and affect   RESULTS  Summary of this visit's results, reviewed and interpreted by myself:   EKG Interpretation  Date/Time:    Ventricular Rate:    PR Interval:    QRS Duration:   QT Interval:    QTC Calculation:   R Axis:     Text Interpretation:        Laboratory Studies: No results found for this or any previous visit (from the past 24 hour(s)). Imaging  Studies: DG Chest 2 View  Result Date: 11/09/2019 CLINICAL DATA:  Cough and congestion EXAM: CHEST - 2 VIEW COMPARISON:  08/14/2003 FINDINGS: No consolidation or effusion. Mild bronchitic changes. Normal heart size. No pneumothorax. IMPRESSION: Mild bronchitic changes. No focal pulmonary infiltrate. Electronically Signed   By: Jasmine Pang M.D.   On: 11/09/2019 01:09    ED COURSE and MDM  Nursing notes, initial and subsequent vitals signs, including pulse oximetry, reviewed and interpreted by myself.  Vitals:   11/09/19 0042 11/09/19 0310 11/09/19 0348  BP: (!) 146/100 (!) 145/107 (!) 144/94  Pulse: 79 73 72  Resp: 20 20 18   Temp: 98.2 F (36.8 C) 98.4 F (36.9 C) 98.2 F (36.8 C)  TempSrc: Oral Oral Oral  SpO2: 100% 98% 99%   Medications  albuterol (VENTOLIN HFA) 108 (90 Base) MCG/ACT inhaler 2 puff (has no administration in time range)    The patient's albuterol inhaler has expired.  We will get him a new inhaler along with an AeroChamber and instructed him in its use.  He is already on an antibiotic and his chest x-ray is not showing pneumonia.  PROCEDURES  Procedures   ED DIAGNOSES     ICD-10-CM   1. Acute bronchitis with bronchospasm  J20.9        Korde Jeppsen, , MD 11/09/19 [phone]
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
## 2020-06-14 ENCOUNTER — Ambulatory Visit: Payer: Managed Care, Other (non HMO) | Admitting: Internal Medicine

## 2020-06-14 ENCOUNTER — Other Ambulatory Visit: Payer: Self-pay

## 2020-06-14 ENCOUNTER — Encounter: Payer: Self-pay | Admitting: Internal Medicine

## 2020-06-14 VITALS — BP 140/100 | HR 78 | Temp 98.1°F | Ht 71.0 in | Wt 254.4 lb

## 2020-06-14 DIAGNOSIS — G4733 Obstructive sleep apnea (adult) (pediatric): Secondary | ICD-10-CM | POA: Diagnosis not present

## 2020-06-14 DIAGNOSIS — J4530 Mild persistent asthma, uncomplicated: Secondary | ICD-10-CM

## 2020-06-14 NOTE — Patient Instructions (Signed)
Order-  DME Christoper Allegra please change auto range to 8-15, continue mask of choice, humidifier, supplies, [employer]view/ card  Please call if we can help

## 2020-07-13 ENCOUNTER — Emergency Department (HOSPITAL_COMMUNITY)
Admission: EM | Admit: 2020-07-13 | Discharge: 2020-07-13 | Disposition: A | Discharge status: Home / Self Care | Payer: No Typology Code available for payment source | Attending: Emergency Medicine | Admitting: Emergency Medicine

## 2020-07-13 ENCOUNTER — Emergency Department (HOSPITAL_COMMUNITY): Payer: No Typology Code available for payment source

## 2020-07-13 ENCOUNTER — Other Ambulatory Visit: Payer: Self-pay

## 2020-07-13 DIAGNOSIS — Y990 Civilian activity done for income or pay: Secondary | ICD-10-CM | POA: Diagnosis not present

## 2020-07-13 DIAGNOSIS — Z79899 Other long term (current) drug therapy: Secondary | ICD-10-CM | POA: Diagnosis not present

## 2020-07-13 DIAGNOSIS — S00511A Abrasion of lip, initial encounter: Secondary | ICD-10-CM | POA: Diagnosis not present

## 2020-07-13 DIAGNOSIS — W19XXXA Unspecified fall, initial encounter: Secondary | ICD-10-CM

## 2020-07-13 DIAGNOSIS — J4530 Mild persistent asthma, uncomplicated: Secondary | ICD-10-CM | POA: Diagnosis not present

## 2020-07-13 DIAGNOSIS — M79642 Pain in left hand: Secondary | ICD-10-CM | POA: Diagnosis not present

## 2020-07-13 DIAGNOSIS — W208XXA Other cause of strike by thrown, projected or falling object, initial encounter: Secondary | ICD-10-CM | POA: Insufficient documentation

## 2020-07-13 DIAGNOSIS — S0993XA Unspecified injury of face, initial encounter: Secondary | ICD-10-CM | POA: Diagnosis present

## 2020-07-13 DIAGNOSIS — Z7952 Long term (current) use of systemic steroids: Secondary | ICD-10-CM | POA: Diagnosis not present

## 2020-07-13 DIAGNOSIS — S0240DA Maxillary fracture, left side, initial encounter for closed fracture: Secondary | ICD-10-CM | POA: Insufficient documentation

## 2020-07-13 DIAGNOSIS — I10 Essential (primary) hypertension: Secondary | ICD-10-CM | POA: Diagnosis not present

## 2020-07-13 MED ORDER — ACETAMINOPHEN 500 MG PO TABS
1000.0000 mg | ORAL_TABLET | Freq: Once | ORAL | Status: AC
  Administered 2020-07-13: 1000 mg via ORAL
  Filled 2020-07-13: qty 2
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
## 2020-07-13 NOTE — Discharge Instructions (Signed)
I recommend a combination of tylenol and ibuprofen for management of your pain. You can take a low dose of both at the same time. I recommend 500 mg of Tylenol combined with 600 mg of ibuprofen. This is one maximum strength Tylenol and three regular ibuprofen. You can take these 2-3 times for day for your pain. Please try to take these medications with a small amount of food as well to prevent upsetting your stomach.  Please continue to ice the left side of your face.  Try to avoid any contact with the left side of your face that would cause more trauma to the region.  Please follow-up with your regular doctor your appointment next week and discuss the CT scan findings regarding the fracture to the left side of your face.  If you develop any new or worsening symptoms, you can always return to the emergency department.  It was a pleasure to meet you.

## 2020-10-26 ENCOUNTER — Encounter: Payer: Self-pay | Admitting: Internal Medicine

## 2020-10-26 NOTE — Assessment & Plan Note (Signed)
Managed by his allergist with no sleep disturbance.

## 2020-10-26 NOTE — Assessment & Plan Note (Signed)
Benefits from CPAP with good compliance and control. Plan- change auto range to 8-15 per request

## 2020-12-20 ENCOUNTER — Other Ambulatory Visit: Payer: Self-pay

## 2020-12-20 ENCOUNTER — Encounter (HOSPITAL_BASED_OUTPATIENT_CLINIC_OR_DEPARTMENT_OTHER): Payer: Self-pay | Admitting: *Deleted

## 2020-12-20 ENCOUNTER — Emergency Department (HOSPITAL_BASED_OUTPATIENT_CLINIC_OR_DEPARTMENT_OTHER)
Admission: EM | Admit: 2020-12-20 | Discharge: 2020-12-20 | Disposition: A | Discharge status: Home / Self Care | Payer: Managed Care, Other (non HMO) | Attending: Emergency Medicine | Admitting: Emergency Medicine

## 2020-12-20 DIAGNOSIS — I10 Essential (primary) hypertension: Secondary | ICD-10-CM | POA: Insufficient documentation

## 2020-12-20 DIAGNOSIS — J069 Acute upper respiratory infection, unspecified: Secondary | ICD-10-CM | POA: Diagnosis not present

## 2020-12-20 DIAGNOSIS — Z79899 Other long term (current) drug therapy: Secondary | ICD-10-CM | POA: Insufficient documentation

## 2020-12-20 DIAGNOSIS — R059 Cough, unspecified: Secondary | ICD-10-CM | POA: Diagnosis present

## 2020-12-20 DIAGNOSIS — U071 COVID-19: Secondary | ICD-10-CM | POA: Insufficient documentation

## 2020-12-20 DIAGNOSIS — J4530 Mild persistent asthma, uncomplicated: Secondary | ICD-10-CM | POA: Insufficient documentation

## 2020-12-20 DIAGNOSIS — Z7951 Long term (current) use of inhaled steroids: Secondary | ICD-10-CM | POA: Diagnosis not present

## 2020-12-20 LAB — SARS CORONAVIRUS 2 (TAT 6-24 HRS): SARS Coronavirus 2: POSITIVE — AB

## 2020-12-20 NOTE — ED Triage Notes (Signed)
Cough, sore throat, body aches, chills, fever x 2 days

## 2020-12-20 NOTE — Discharge Instructions (Signed)
If you develop high fever, severe cough or cough with blood, trouble breathing, severe headache, neck pain/stiffness, vomiting, or any other new/concerning symptoms then return to the ER for evaluation  

## 2020-12-20 NOTE — ED Provider Notes (Signed)
[facility] EMERGENCY DEPARTMENT Provider Note   CSN: [id number] Arrival date & time: 12/20/20  1142     History Chief Complaint  Patient presents with   URI    KONNER SAIZ is a 55 y.o. male.  HPI 55 year old male presents with low-grade fever, cough, sore throat, body aches and myalgias with some fatigue starting 2 days ago.  Sometimes short of breath right after coughing but otherwise no.  Has occasionally heard a wheeze and has taken his albuterol inhaler but no significant wheezing.  Has tried Advil, Tylenol, Mucinex.  Past Medical History:  Diagnosis Date   Asthma    Epididymitis    HTN (hypertension)    Hyperlipemia     Patient Active Problem List   Diagnosis Date Noted   Nasal polyps 06/15/2019   Asthma, mild persistent 06/15/2019   Seasonal and perennial allergic rhinitis 06/15/2019   Obstructive sleep apnea 12/19/2011    Past Surgical History:  Procedure Laterality Date   CHOLECYSTECTOMY N/A    NASAL SINUS SURGERY     VASECTOMY         Family History  Problem Relation Age of Onset   Heart disease Father        bypass surgeries x 2   Asthma Mother    Cancer Other        grandfather    Social History   Tobacco Use   Smoking status: Never   Smokeless tobacco: Never  Vaping Use   Vaping Use: Never used  Substance Use Topics   Alcohol use: Not Currently   Drug use: No    Home Medications Prior to Admission medications   Medication Sig Start Date End Date Taking? Authorizing Provider  albuterol (PROVENTIL HFA;VENTOLIN HFA) 108 (90 BASE) MCG/ACT inhaler Inhale 2 puffs into the lungs every 6 (six) hours as needed.   Yes [provider]  amLODipine (NORVASC) 10 MG tablet Take by mouth. 04/23/19  Yes [provider]  amLODIPine-Valsartan-HCTZ 5-160-12.5 MG TABS Take by mouth. As directed   Yes [provider]  atorvastatin (LIPITOR) 20 MG tablet Take 20 mg by mouth daily.   Yes [provider]   EPINEPHrine 0.3 mg/0.3 mL IJ SOAJ injection  04/07/17  Yes [provider]  fluticasone (FLONASE) 50 MCG/ACT nasal spray Place 2 sprays into the nose daily.   Yes [provider]  mometasone-formoterol (DULERA) 200-5 MCG/ACT AERO 2 Puffs 10/02/19  Yes [provider]  fluticasone-salmeterol (ADVAIR HFA) 115-21 MCG/ACT inhaler Inhale 2 puffs into the lungs 2 (two) times daily.    [provider]  hydrochlorothiazide (MICROZIDE) 12.5 MG capsule Take 1 capsule by mouth daily. 10/19/19   [provider]  sildenafil (VIAGRA) 50 MG tablet Take by mouth. 05/08/19   [provider]    Allergies    Patient has no known allergies.  Review of Systems   Review of Systems  Constitutional:  Positive for fever.  HENT:  Positive for sore throat.   Respiratory:  Positive for cough.   Musculoskeletal:  Positive for myalgias.   Physical Exam Updated Vital Signs BP (!) 131/97 (BP Location: Right Arm)   Pulse 84   Temp 98.9 F (37.2 C) (Oral)   Resp 18   Ht 5\' 11"  (1.803 m)   Wt 108.9 kg   SpO2 97%   BMI 33.47 kg/m   Physical Exam Vitals and nursing note reviewed.  Constitutional:      General: He is not in acute  distress.    Appearance: He is well-developed. He is not ill-appearing or diaphoretic.  HENT:     Head: Normocephalic and atraumatic.     Right Ear: External ear normal.     Left Ear: External ear normal.     Nose: Nose normal.     Mouth/Throat:     Pharynx: Posterior oropharyngeal erythema present. No oropharyngeal exudate.  Eyes:     General:        Right eye: No discharge.        Left eye: No discharge.  Cardiovascular:     Rate and Rhythm: Normal rate and regular rhythm.     Heart sounds: Normal heart sounds.  Pulmonary:     Effort: Pulmonary effort is normal. No tachypnea or accessory muscle usage.     Breath sounds: Normal breath sounds. No wheezing.  Abdominal:     General: There is no distension.     Palpations:  Abdomen is soft.     Tenderness: There is no abdominal tenderness.  Musculoskeletal:     Cervical back: Neck supple.  Skin:    General: Skin is warm and dry.  Neurological:     Mental Status: He is alert.  Psychiatric:        Mood and Affect: Mood is not anxious.    ED Results / Procedures / Treatments   Labs (all labs ordered are listed, but only abnormal results are displayed) Labs Reviewed  SARS CORONAVIRUS 2 (TAT 6-24 HRS)    EKG None  Radiology No results found.  Procedures Procedures   Medications Ordered in ED Medications - No data to display  ED Course  I have reviewed the triage vital signs and the nursing notes.  Pertinent labs & imaging results that were available during my care of the patient were reviewed by me and considered in my medical decision making (see chart for details).    MDM Rules/Calculators/A&P                           Patient is well-appearing.  Most likely has a viral illness.  With no abnormal lung sounds I doubt significant asthma exacerbation or pneumonia.  I do not think chest x-ray is warranted.  His vitals are unremarkable besides some mild hypertension.  Will COVID test and otherwise we discussed supportive care.  Discharged home with return precautions. Final Clinical Impression(s) / ED Diagnoses Final diagnoses:  Acute upper respiratory infection    Rx / DC Orders ED Discharge Orders     None        Pricilla Loveless, MD 12/20/20 1415

## 2021-02-14 ENCOUNTER — Encounter (HOSPITAL_BASED_OUTPATIENT_CLINIC_OR_DEPARTMENT_OTHER): Payer: Self-pay | Admitting: *Deleted

## 2021-02-14 ENCOUNTER — Other Ambulatory Visit: Payer: Self-pay

## 2021-02-14 ENCOUNTER — Emergency Department (HOSPITAL_BASED_OUTPATIENT_CLINIC_OR_DEPARTMENT_OTHER): Payer: Managed Care, Other (non HMO)

## 2021-02-14 ENCOUNTER — Emergency Department (HOSPITAL_BASED_OUTPATIENT_CLINIC_OR_DEPARTMENT_OTHER)
Admission: EM | Admit: 2021-02-14 | Discharge: 2021-02-14 | Disposition: A | Discharge status: Home / Self Care | Payer: Managed Care, Other (non HMO) | Attending: Emergency Medicine | Admitting: Emergency Medicine

## 2021-02-14 DIAGNOSIS — I10 Essential (primary) hypertension: Secondary | ICD-10-CM | POA: Diagnosis not present

## 2021-02-14 DIAGNOSIS — J4530 Mild persistent asthma, uncomplicated: Secondary | ICD-10-CM | POA: Insufficient documentation

## 2021-02-14 DIAGNOSIS — W01198A Fall on same level from slipping, tripping and stumbling with subsequent striking against other object, initial encounter: Secondary | ICD-10-CM | POA: Insufficient documentation

## 2021-02-14 DIAGNOSIS — Z79899 Other long term (current) drug therapy: Secondary | ICD-10-CM | POA: Insufficient documentation

## 2021-02-14 DIAGNOSIS — S8991XA Unspecified injury of right lower leg, initial encounter: Secondary | ICD-10-CM | POA: Diagnosis not present

## 2021-02-14 DIAGNOSIS — M79604 Pain in right leg: Secondary | ICD-10-CM

## 2021-02-14 DIAGNOSIS — W19XXXA Unspecified fall, initial encounter: Secondary | ICD-10-CM

## 2021-02-14 NOTE — ED Notes (Signed)
Swelling, tenderness, bruising to right proximal lower leg after falling and striking leg on a rock this morning.

## 2021-02-14 NOTE — ED Provider Notes (Signed)
[facility] EMERGENCY DEPARTMENT Provider Note   CSN: [id number] Arrival date & time: 02/14/21  1754     History Chief Complaint  Patient presents with   Leg Injury   Fall    Ernest Wilcox is a 55 y.o. male no significant past medical history presents after slipping on wet grass and hitting his right lower leg on a rock earlier today.  Patient reports that it was significantly painful, and the leg is swollen where he hit the rock, however he has been able to bear weight without difficulty.  Patient last took ibuprofen around 6 hours prior to arrival, reports his pain is 7/10 at this time.  Patient denies any numbness or tingling in the foot.   Fall      Past Medical History:  Diagnosis Date   Asthma    Epididymitis    HTN (hypertension)    Hyperlipemia     Patient Active Problem List   Diagnosis Date Noted   Nasal polyps 06/15/2019   Asthma, mild persistent 06/15/2019   Seasonal and perennial allergic rhinitis 06/15/2019   Obstructive sleep apnea 12/19/2011    Past Surgical History:  Procedure Laterality Date   CHOLECYSTECTOMY N/A    NASAL SINUS SURGERY     VASECTOMY         Family History  Problem Relation Age of Onset   Heart disease Father        bypass surgeries x 2   Asthma Mother    Cancer Other        grandfather    Social History   Tobacco Use   Smoking status: Never   Smokeless tobacco: Never  Vaping Use   Vaping Use: Never used  Substance Use Topics   Alcohol use: Not Currently   Drug use: No    Home Medications Prior to Admission medications   Medication Sig Start Date End Date Taking? Authorizing Provider  albuterol (PROVENTIL HFA;VENTOLIN HFA) 108 (90 BASE) MCG/ACT inhaler Inhale 2 puffs into the lungs every 6 (six) hours as needed.    [provider]  amLODipine (NORVASC) 10 MG tablet Take by mouth. 04/23/19   [provider]  amLODIPine-Valsartan-HCTZ 5-160-12.5 MG TABS Take by mouth. As directed     [provider]  atorvastatin (LIPITOR) 20 MG tablet Take 20 mg by mouth daily.    [provider]  EPINEPHrine 0.3 mg/0.3 mL IJ SOAJ injection  04/07/17   [provider]  fluticasone (FLONASE) 50 MCG/ACT nasal spray Place 2 sprays into the nose daily.    [provider]  fluticasone-salmeterol (ADVAIR HFA) 115-21 MCG/ACT inhaler Inhale 2 puffs into the lungs 2 (two) times daily.    [provider]  hydrochlorothiazide (MICROZIDE) 12.5 MG capsule Take 1 capsule by mouth daily. 10/19/19   [provider]  mometasone-formoterol (DULERA) 200-5 MCG/ACT AERO 2 Puffs 10/02/19   [provider]  sildenafil (VIAGRA) 50 MG tablet Take by mouth. 05/08/19   [provider]    Allergies    Patient has no known allergies.  Review of Systems   Review of Systems  Musculoskeletal:        Leg pain  All other systems reviewed and are negative.  Physical Exam Updated Vital Signs BP (!) 134/99 (BP Location: Right Arm)   Pulse 84   Temp 98.5 F (36.9 C) (Oral)   Resp 18   Ht 5\' 11"  (1.803 m)   Wt 108.9 kg   SpO2 100%  BMI 33.48 kg/m   Physical Exam Vitals and nursing note reviewed.  Constitutional:      General: He is not in acute distress.    Appearance: Normal appearance.  HENT:     Head: Normocephalic and atraumatic.  Eyes:     General:        Right eye: No discharge.        Left eye: No discharge.  Cardiovascular:     Rate and Rhythm: Normal rate and regular rhythm.     Pulses: Normal pulses.     Comments: Intact DP/PT pulses of the RLE Pulmonary:     Effort: Pulmonary effort is normal. No respiratory distress.  Musculoskeletal:        General: Swelling present. No deformity.     Comments: No TTP right knee, no effusion, intact strength 5/5 of RLE to flexion, extension of knee, ankle joints.   Skin:    General: Skin is warm and dry.     Capillary Refill: Capillary refill takes less than 2 seconds.     Comments:  Approx 5cm of soft tissue swelling without evidence of blood or fluctuant fluid collection on the anterior aspect of the proximal right shin.  Neurological:     Mental Status: He is alert and oriented to person, place, and time.     Comments: No sensory deficit  Psychiatric:        Mood and Affect: Mood normal.        Behavior: Behavior normal.    ED Results / Procedures / Treatments   Labs (all labs ordered are listed, but only abnormal results are displayed) Labs Reviewed - No data to display  EKG None  Radiology DG Tibia/Fibula Right  Result Date: 02/14/2021 CLINICAL DATA:  Slip and fall with right lower leg pain. Struck leg on a rock. Swelling. Rule out fracture. EXAM: RIGHT TIBIA AND FIBULA - 2 VIEW COMPARISON:  None. FINDINGS: Cortical margins of the tibia and fibula are intact. There is no evidence of fracture or other focal bone lesions. Knee and ankle alignment are maintained. Soft tissue edema about the upper aspect of the lower leg, more prominent medially. IMPRESSION: Soft tissue edema without acute fracture. Electronically Signed   By: Narda Rutherford M.D.   On: 02/14/2021 18:47    Procedures Procedures   Medications Ordered in ED Medications - No data to display  ED Course  I have reviewed the triage vital signs and the nursing notes.  Pertinent labs & imaging results that were available during my care of the patient were reviewed by me and considered in my medical decision making (see chart for details).    MDM Rules/Calculators/A&P                         Patient with traumatic injury of the RLE secondary to fall. Neurovascularly intact RLE at this time. Benign radiographic findings, no fracture or dislocation. Discussed pain control with ibuprofen, tylenol, rest, ice, compression, elevation of the affected extremity. Discussed I do not see evidence of hematoma, it appears to be normal soft tissue swelling at this time. Patient requests "having it drained" which  I declined as this is not clinically indicated and would not improve patient's condition at this time. Discharged in stable condition, return precautions given.  Final Clinical Impression(s) / ED Diagnoses Final diagnoses:  Fall, initial encounter  Right leg pain    Rx / DC Orders ED Discharge Orders  None        Olene Floss, PA-C 02/14/21 1921    Melene Plan, DO 02/14/21 1922

## 2021-02-14 NOTE — ED Triage Notes (Signed)
He slipped on wet grass and fell hitting his right lower leg on a rock. Large amount of swelling to his lower leg. He is ambulatory.

## 2021-02-14 NOTE — Discharge Instructions (Signed)
Please use Tylenol or ibuprofen for pain.  You may use 600 mg ibuprofen every 6 hours or 1000 mg of Tylenol every 6 hours.  You may choose to alternate between the 2.  This would be most effective.  Not to exceed 4 g of Tylenol within 24 hours.  Not to exceed 3200 mg ibuprofen 24 hours.  Please follow up with orthopedics if your pain and swelling do not improve despite ice, rest, elevation, compression.

## 2021-06-13 NOTE — Progress Notes (Deleted)
?HPI ?M divorced never smoker followed for OSA, complicated by Asthma (Dr [place] Callas), Allergic Rhinitis, Nasal polyps HTN, Hyperlipidemia, ?Hx nasal polypectomy, maxillary sinus surgery. ?HST 11/23/11- AHI 10.3/ hr, Epworth 10/ 24, Weight 220 lbs ? ?=========================================================== ? ? ?06/14/20- [name] divorced never smoker followed for OSA, complicated by Asthma (Dr [place] Callas), Allergic Rhinitis, Nasal polyps HTN, Hyperlipidemia, ?Hx nasal polypectomy, maxillary sinus surgery. ?CPAP auto 5-15/[facility] ?Download- compliance 93%, AHI 1.1/ hr ?Body weight today-254 lbs ?Covid vax-3 Phizer ?Flu vax-had ?-----Patient feels good overall, machine is working good, schedule at work just got switched so sleep is off a little. ?Recent change from 3rd shift to first shift. Plans to retire and move to [place] in 18 months. ?Would like autopap pressure to start higher- download reviewed. ? ?06/16/21- 55yoM divorced never smoker followed for OSA, complicated by Asthma (Dr [place] Callas), Allergic Rhinitis, Nasal polyps HTN, Hyperlipidemia, ?Hx nasal polypectomy, maxillary sinus surgery. ?CPAP auto 5-15/[facility] ?Download- compliance  ?Body weight today- ?Covid vax-3 Phizer ?Flu vax- ? ? ?ROS-see HPI   + = positive ?Constitutional:    weight loss, night sweats, fevers, chills, fatigue, lassitude. ?HEENT:    headaches, difficulty swallowing, tooth/dental problems, sore throat,  ?     sneezing, itching, ear ache, +nasal congestion, post nasal drip, snoring ?CV:    chest pain, orthopnea, PND, swelling in lower extremities, anasarca,                                   ?dizziness, palpitations ?Resp:   shortness of breath with exertion or at rest.   ?             productive cough,   non-productive cough, coughing up of blood.   ?           change in color of mucus.  wheezing.   ?Skin:    rash or lesions. ?GI:  No-   heartburn, indigestion, abdominal pain, nausea, vomiting, diarrhea,  ?               change in bowel habits, loss  of appetite ?GU: dysuria, change in color of urine, no urgency or frequency.   flank pain. ?MS:   joint pain, stiffness, decreased range of motion, back pain. ?Neuro-     nothing unusual ?Psych:  change in mood or affect.  depression or anxiety.   memory loss. ? ?OBJ- Physical Exam ?General- Alert, Oriented, Affect-appropriate, Distress- none acute, +stocky/ overweight ?Skin- rash-none, lesions- none, excoriation- none ?Lymphadenopathy- none ?Head- atraumatic ?           Eyes- Gross vision intact, PERRLA, conjunctivae and secretions clear ?           Ears- Hearing, canals-normal ?           Nose- Clear, no-Septal dev, mucus, +polyps, erosion, perforation  ?           Throat- Mallampati III , mucosa clear , drainage- none, tonsils- atrophic ?Neck- flexible , trachea midline, no stridor , thyroid nl, carotid no bruit ?Chest - symmetrical excursion , unlabored ?          Heart/CV- RRR , no murmur , no gallop  , no rub, nl s1 s2 ?                          - JVD- none , edema- none, stasis changes- none, varices- none ?  Lung- clear to P&A, wheeze- none, cough- none , dullness-none, rub- none ?          Chest wall-  ?Abd-  ?Br/ Gen/ Rectal- Not done, not indicated ?Extrem- cyanosis- none, clubbing, none, atrophy- none, strength- nl ?Neuro- grossly intact to observation ? ? ? ?

## 2021-06-16 ENCOUNTER — Ambulatory Visit: Payer: Managed Care, Other (non HMO) | Admitting: Internal Medicine
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
## 2023-07-14 ENCOUNTER — Encounter: Payer: Self-pay | Admitting: Internal Medicine

## 2023-07-22 ENCOUNTER — Ambulatory Visit: Payer: Managed Care, Other (non HMO) | Admitting: Internal Medicine

## 2023-09-08 ENCOUNTER — Telehealth: Payer: Self-pay | Admitting: Internal Medicine

## 2023-09-08 NOTE — Telephone Encounter (Signed)
 Called pt regarding elapse in previous [employer]. He did verify that it has since been updated to [facility]. At this time I am unable to run a RTE through [employer] w/ [employer] or [employer], but confirmed he will bring the information at his appt.   At this time he did ask our location and confirmed his appt time. I was able to confirm and placed a reminder in the mail for him as well, for he does not MyChart activity.
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
## 2023-09-13 ENCOUNTER — Encounter: Payer: Self-pay | Admitting: Internal Medicine

## 2023-09-13 ENCOUNTER — Ambulatory Visit (INDEPENDENT_AMBULATORY_CARE_PROVIDER_SITE_OTHER): Payer: Self-pay | Admitting: Internal Medicine

## 2023-09-13 VITALS — BP 133/90 | HR 81 | Temp 98.3°F | Ht 70.5 in | Wt 262.6 lb

## 2023-09-13 DIAGNOSIS — G4733 Obstructive sleep apnea (adult) (pediatric): Secondary | ICD-10-CM | POA: Diagnosis not present

## 2023-09-13 NOTE — Assessment & Plan Note (Signed)
 Managed by his Allergist. Well-controlled now.

## 2023-09-13 NOTE — Patient Instructions (Signed)
 You are doing great with CPAP. We can continue auto 8-15.  Please let us  know if we can help

## 2023-09-13 NOTE — Assessment & Plan Note (Signed)
Benefits from CPAP with good compliance and control Plan-continue auto 8-15
